# Patient Record
Sex: Male | Born: 1940 | Race: White | Hispanic: No | Marital: Married
Health system: Southern US, Community
[De-identification: ages and names within clinical notes are randomized; demographics above are authoritative.]

---

## 1998-12-21 ENCOUNTER — Other Ambulatory Visit: Admission: RE | Admit: 1998-12-21 | Discharge: 1998-12-21 | Payer: Self-pay | Admitting: Family Medicine

## 1999-02-15 ENCOUNTER — Ambulatory Visit (HOSPITAL_COMMUNITY): Admission: RE | Admit: 1999-02-15 | Discharge: 1999-02-15 | Payer: Self-pay | Admitting: Gastroenterology

## 1999-03-09 ENCOUNTER — Ambulatory Visit (HOSPITAL_COMMUNITY): Admission: RE | Admit: 1999-03-09 | Discharge: 1999-03-09 | Payer: Self-pay | Admitting: Gastroenterology

## 1999-03-09 ENCOUNTER — Encounter: Payer: Self-pay | Admitting: Gastroenterology

## 1999-03-20 ENCOUNTER — Ambulatory Visit (HOSPITAL_COMMUNITY): Admission: RE | Admit: 1999-03-20 | Discharge: 1999-03-20 | Payer: Self-pay | Admitting: Family Medicine

## 1999-03-20 ENCOUNTER — Encounter: Payer: Self-pay | Admitting: Family Medicine

## 1999-03-31 ENCOUNTER — Ambulatory Visit (HOSPITAL_COMMUNITY): Admission: RE | Admit: 1999-03-31 | Discharge: 1999-03-31 | Payer: Self-pay | Admitting: Family Medicine

## 1999-03-31 ENCOUNTER — Encounter: Payer: Self-pay | Admitting: Family Medicine

## 2003-09-21 ENCOUNTER — Encounter: Admission: RE | Admit: 2003-09-21 | Discharge: 2003-09-21 | Payer: Self-pay | Admitting: Family Medicine

## 2003-11-07 ENCOUNTER — Emergency Department (HOSPITAL_COMMUNITY): Admission: EM | Admit: 2003-11-07 | Discharge: 2003-11-07 | Payer: Self-pay | Admitting: Emergency Medicine

## 2004-03-13 ENCOUNTER — Encounter (INDEPENDENT_AMBULATORY_CARE_PROVIDER_SITE_OTHER): Payer: Self-pay | Admitting: *Deleted

## 2004-03-13 ENCOUNTER — Ambulatory Visit (HOSPITAL_COMMUNITY): Admission: RE | Admit: 2004-03-13 | Discharge: 2004-03-13 | Payer: Self-pay | Admitting: Gastroenterology

## 2004-04-04 ENCOUNTER — Encounter: Admission: RE | Admit: 2004-04-04 | Discharge: 2004-04-04 | Payer: Self-pay | Admitting: Family Medicine

## 2004-04-10 ENCOUNTER — Encounter: Admission: RE | Admit: 2004-04-10 | Discharge: 2004-04-10 | Payer: Self-pay | Admitting: Family Medicine

## 2004-09-05 ENCOUNTER — Encounter: Admission: RE | Admit: 2004-09-05 | Discharge: 2004-09-05 | Payer: Self-pay | Admitting: Family Medicine

## 2005-03-08 ENCOUNTER — Encounter (HOSPITAL_COMMUNITY): Admission: RE | Admit: 2005-03-08 | Discharge: 2005-06-06 | Payer: Self-pay | Admitting: Endocrinology

## 2005-03-25 IMAGING — CR DG CHEST 2V
2 series · 2 of 2 positions shown · non-contrast
Comparison: none

CLINICAL DATA: Cough.
 CHEST X-RAY:
 Two views of the chest are compared to a chest x-ray of 11/07/03.  Linear atelectasis or scarring is again noted at the left lung base.  No active infiltrate or effusion is seen.  The lungs are hyperaerated.  Nodular opacity at the right lung base probably represents nipple shadow.  A repeat PA view with nipple markers would be helpful. The heart is within normal limits in size.  Thoracolumbar scoliosis is noted.

[view not recorded (1 of 2)]
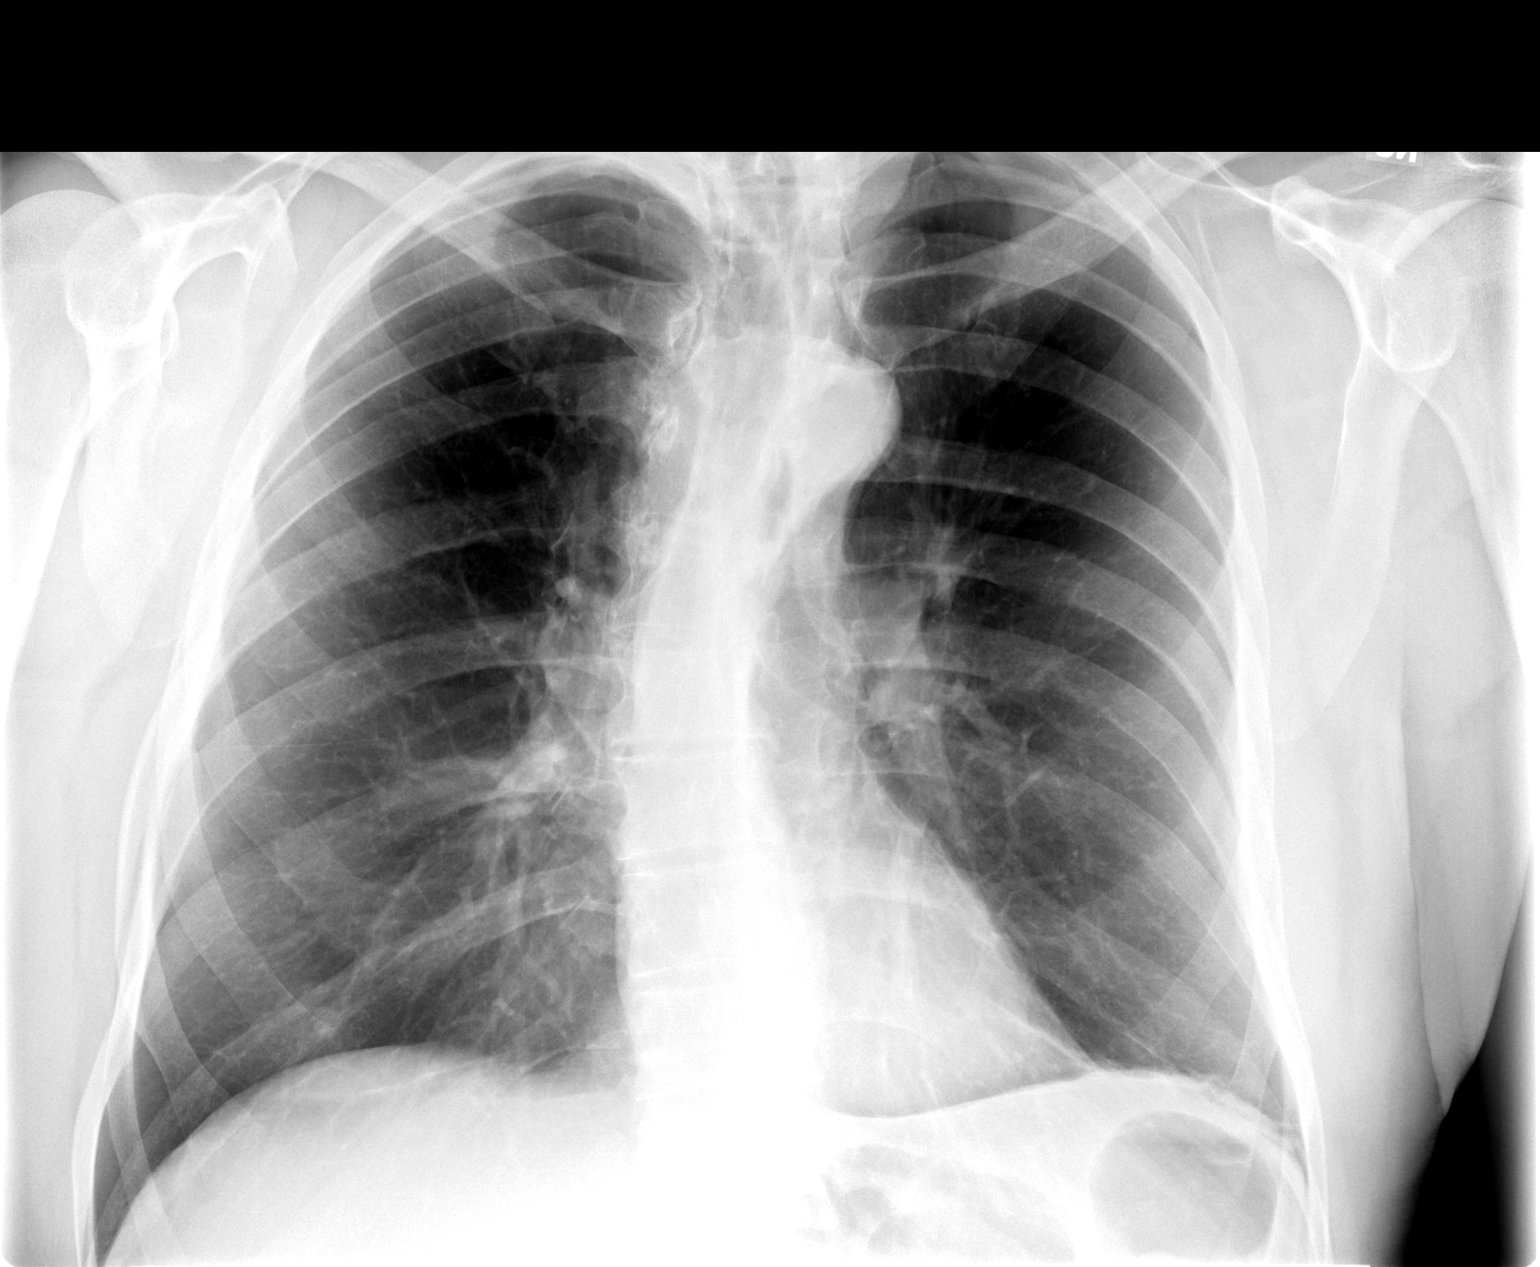

[view not recorded (2 of 2)]
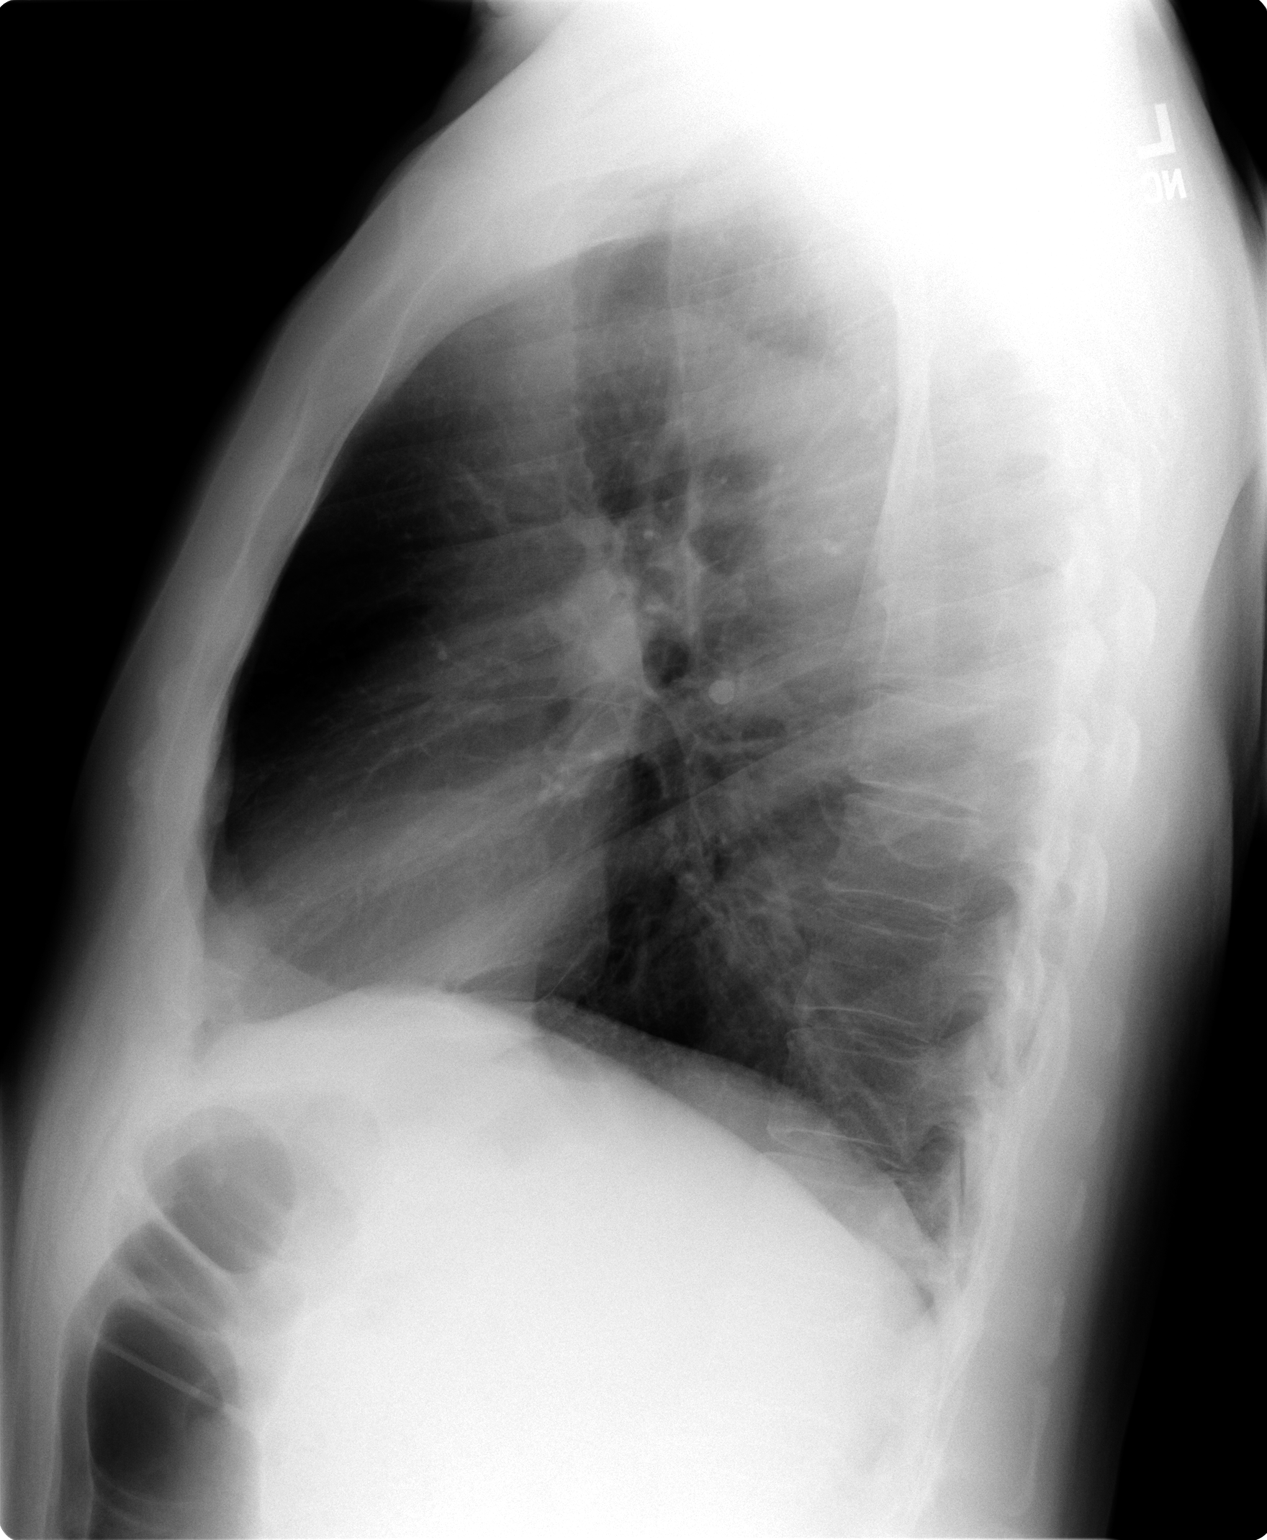

[2 of 2 positions shown; findings below may reference images not displayed]

IMPRESSION: 1.  COPD and probable linear scarring at the left base. 
 2.  Nodular opacity at the right lung base may represent nipple shadow.  Suggest repeat PA chest x-ray with nipple markers.

## 2005-03-31 IMAGING — CR DG CHEST SPECIAL VIEW
1 series · 1 of 1 positions shown · non-contrast
Comparison: none

CLINICAL DATA: Abnormal chest x-ray.
 REPEAT PA CHEST FILM WITH NIPPLE MARKERS
 I do not see the density that was seen on the prior film for certain.  It appears to correspond with the nipple marker, and it is probably a nipple shadow.  The cardiac silhouette, mediastinal and hilar contours are unchanged.  Minimal basilar scarring changes particularly on the left. 
 IMPRESSION
 No definite pulmonary nodule.  The density seen on the prior film appears to correspond to the patient?s right nipple.

[view not recorded]
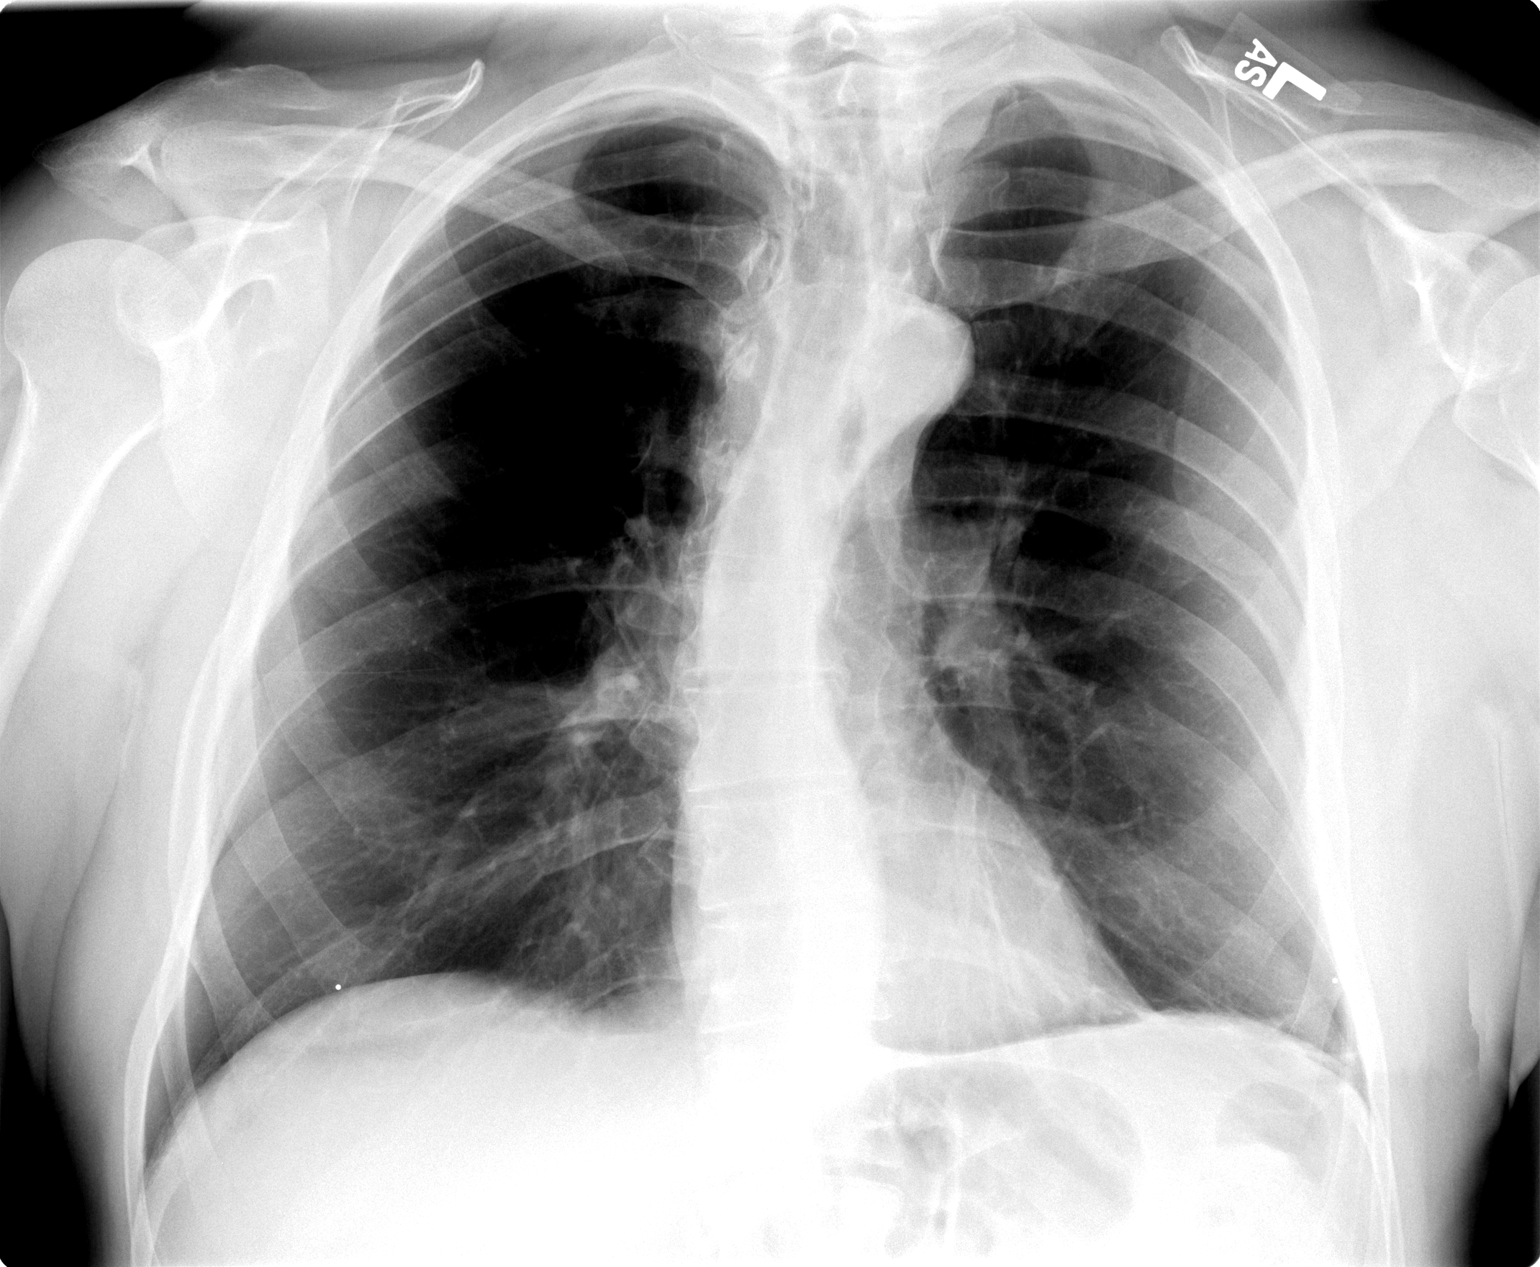

[1 of 1 positions shown; findings below may reference images not displayed]

## 2009-06-27 ENCOUNTER — Inpatient Hospital Stay (HOSPITAL_COMMUNITY): Admission: RE | Admit: 2009-06-27 | Discharge: 2009-06-28 | Payer: Self-pay | Admitting: Urology

## 2009-06-27 ENCOUNTER — Encounter (INDEPENDENT_AMBULATORY_CARE_PROVIDER_SITE_OTHER): Payer: Self-pay | Admitting: Urology

## 2010-09-03 LAB — COMPREHENSIVE METABOLIC PANEL
ALT: 15 U/L (ref 0–53)
AST: 19 U/L (ref 0–37)
Alkaline Phosphatase: 56 U/L (ref 39–117)
CO2: 28 mEq/L (ref 19–32)
Chloride: 101 mEq/L (ref 96–112)
GFR calc Af Amer: >60 mL/min (ref >60)
GFR calc non Af Amer: >60 mL/min (ref >60)
Glucose, Bld: 82 mg/dL (ref 70–99)
Sodium: 134 mEq/L — ABNORMAL LOW (ref 135–145)
Total Bilirubin: 0.7 mg/dL (ref 0.3–1.2)

## 2010-09-03 LAB — CBC
HCT: 47.1 % (ref 39.0–52.0)
Hemoglobin: 15.6 g/dL (ref 13.0–17.0)
MCV: 92.1 fL (ref 78.0–100.0)

## 2010-09-03 LAB — HEMOGLOBIN AND HEMATOCRIT, BLOOD
HCT: 35.3 % — ABNORMAL LOW (ref 39.0–52.0)
HCT: 41.1 % (ref 39.0–52.0)
Hemoglobin: 11.9 g/dL — ABNORMAL LOW (ref 13.0–17.0)
Hemoglobin: 13.7 g/dL (ref 13.0–17.0)

## 2010-09-03 LAB — TYPE AND SCREEN: ABO/RH(D): O POS

## 2010-09-03 LAB — ABO/RH: ABO/RH(D): O POS

## 2010-11-03 NOTE — Op Note (Signed)
NAMESHERRELL, Phillip Powers               ACCOUNT NO.:  [id number]   MEDICAL RECORD NO.:  [id number]          PATIENT TYPE:  AMB   LOCATION:  ENDO                         FACILITY:  [facility]   PHYSICIAN:  James L. Malon Kindle., M.D.DATE OF BIRTH:  06-Jul-1940   DATE OF PROCEDURE:  03/13/2004  DATE OF DISCHARGE:                                 OPERATIVE REPORT   PROCEDURE:  Colonoscopy and polypectomy.   MEDICATIONS:  1.  Fentanyl 62.5 mcg.  2.  Versed 8 mg IV.   INDICATION:  Previous history of adenomatous colon polyps.  This is done as  a 5 year follow up.   DESCRIPTION OF PROCEDURE:  The procedure had been explained to the patient  and consent obtained.  With the patient in the left lateral decubitus  position, the Olympus scope was inserted and advanced.  The patient had a  long, tortuous colon.  We were unable to pass hepatic flexure on previous  colonoscopies using abdominal pressure or position changes.  Using the  pediatric adjustable scope.  We were able to get to the hepatic flexure.  Then finally, with the patient in the right lateral decubitus position and  marked abdominal pressure and several people pressing on the stomach, we  were able to advance down to the cecum.  Ileocecal valve and appendiceal  orifice were seen.  The scope was withdrawn, and the ascending colon, cecum,  hepatic flexure were normal as was hepatic flexure with no polyps seen.  At  90 cm from the anal verge, what was felt to be the descending colon, a 0.75  cm polyp was removed with the snare.  In the sigmoid colon at 50 cm from the  anal verge, a 0.5 cm polyp was removed with the snare.  Both polyps were  recovered and placed in separate jars.  The scope was withdrawn.  No other  polyps were seen.  The patient tolerated the procedure well.   ASSESSMENT:  Polyps removed from the descending and sigmoid colon.  211.3.   PLAN:  1.  Routine postpolypectomy instructions.  2.  We will recommend repeating in 3  years and will hopefully will be able      to use the adult adjustable colonoscope.     JLE/MEDQ  D:  03/13/2004  T:  03/13/2004  Job:  [id number]   cc:   L. Lupe Carney, M.D.  [address] [place]  [place] [address]  Fax: [phone]

## 2011-08-03 ENCOUNTER — Other Ambulatory Visit: Payer: Self-pay | Admitting: Family Medicine

## 2011-08-03 DIAGNOSIS — R42 Dizziness and giddiness: Secondary | ICD-10-CM

## 2011-08-07 ENCOUNTER — Ambulatory Visit
Admission: RE | Admit: 2011-08-07 | Discharge: 2011-08-07 | Disposition: A | Discharge status: Home / Self Care | Payer: Medicare Other | Source: Ambulatory Visit | Attending: Family Medicine | Admitting: Family Medicine

## 2011-08-07 DIAGNOSIS — R42 Dizziness and giddiness: Secondary | ICD-10-CM

## 2011-08-07 MED ORDER — IOHEXOL 300 MG/ML  SOLN
75.0000 mL | Freq: Once | INTRAMUSCULAR | Status: AC | PRN
  Administered 2011-08-07: 75 mL via INTRAVENOUS

## 2018-04-01 ENCOUNTER — Emergency Department (HOSPITAL_COMMUNITY)
Admission: EM | Admit: 2018-04-01 | Discharge: 2018-04-01 | Disposition: A | Discharge status: Home / Self Care | Payer: Medicare Other | Attending: Emergency Medicine | Admitting: Emergency Medicine

## 2018-04-01 ENCOUNTER — Other Ambulatory Visit: Payer: Self-pay

## 2018-04-01 ENCOUNTER — Emergency Department (HOSPITAL_COMMUNITY): Payer: Medicare Other

## 2018-04-01 DIAGNOSIS — R55 Syncope and collapse: Secondary | ICD-10-CM

## 2018-04-01 DIAGNOSIS — N390 Urinary tract infection, site not specified: Secondary | ICD-10-CM | POA: Insufficient documentation

## 2018-04-01 LAB — BASIC METABOLIC PANEL
Anion gap: 7 (ref 5–15)
BUN: 14 mg/dL (ref 8–23)
CALCIUM: 8.8 mg/dL — AB (ref 8.9–10.3)
CO2: 25 mmol/L (ref 22–32)
Chloride: 108 mmol/L (ref 98–111)
Creatinine, Ser: 1.07 mg/dL (ref 0.61–1.24)
GFR calc Af Amer: >60 mL/min (ref >60)
GLUCOSE: 109 mg/dL — AB (ref 70–99)
Potassium: 4.2 mmol/L (ref 3.5–5.1)
Sodium: 140 mmol/L (ref 135–145)

## 2018-04-01 LAB — CBC
HCT: 38.6 % — ABNORMAL LOW (ref 39.0–52.0)
Hemoglobin: 11.8 g/dL — ABNORMAL LOW (ref 13.0–17.0)
MCH: 24.7 pg — ABNORMAL LOW (ref 26.0–34.0)
MCHC: 30.6 g/dL (ref 30.0–36.0)
MCV: 80.8 fL (ref 80.0–100.0)
PLATELETS: 220 10*3/uL (ref 150–400)
RBC: 4.78 MIL/uL (ref 4.22–5.81)
RDW: 16 % — ABNORMAL HIGH (ref 11.5–15.5)
WBC: 7.2 10*3/uL (ref 4.0–10.5)
nRBC: 0 % (ref 0.0–0.2)

## 2018-04-01 LAB — CBG MONITORING, ED: Glucose-Capillary: 98 mg/dL (ref 70–99)

## 2018-04-01 LAB — URINALYSIS, ROUTINE W REFLEX MICROSCOPIC
Bilirubin Urine: NEGATIVE
Glucose, UA: NEGATIVE mg/dL
Hgb urine dipstick: NEGATIVE
KETONES UR: 20 mg/dL — AB
Leukocytes, UA: NEGATIVE
Nitrite: POSITIVE — AB
PH: 6 (ref 5.0–8.0)
Protein, ur: NEGATIVE mg/dL
Specific Gravity, Urine: 1.018 (ref 1.005–1.030)

## 2018-04-01 LAB — I-STAT TROPONIN, ED: TROPONIN I, POC: 0 ng/mL (ref 0.00–0.08)

## 2018-04-01 MED ORDER — SULFAMETHOXAZOLE-TRIMETHOPRIM 800-160 MG PO TABS
1.0000 | ORAL_TABLET | Freq: Once | ORAL | Status: AC
  Administered 2018-04-01: 1 via ORAL
  Filled 2018-04-01: qty 1

## 2018-04-01 MED ORDER — SULFAMETHOXAZOLE-TRIMETHOPRIM 800-160 MG PO TABS: 1.0000 | ORAL_TABLET | Freq: Two times a day (BID) | ORAL | 0 refills | Status: AC

## 2018-04-01 NOTE — ED Notes (Signed)
Bed: WA01 Expected date:  Expected time:  Means of arrival:  Comments: EMS syncope 

## 2018-04-01 NOTE — ED Triage Notes (Signed)
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 Pt did not want to come to hospital. EMS states that gait is not steady which is not baseline. AO x4. Vitals were unstable on EMS arrival, but are now. Orthostatic positive. 110/72, HR 88, 97%, CBG 122

## 2018-04-01 NOTE — Discharge Instructions (Addendum)
You were evaluated in the Emergency Department and after careful evaluation, we did not find any emergent condition requiring admission or further testing in the hospital.  Your symptoms today seem to be due to urinary tract infection.  Please use the medication provided as directed and follow-up with your primary care provider.  We also recommend drinking plenty of fluids at home to stay hydrated.  Please return to the Emergency Department if you experience any worsening of your condition.  We encourage you to follow up with a primary care provider.  Thank you for allowing [place] to be a part of your care.

## 2018-04-01 NOTE — ED Provider Notes (Signed)
[facility] Emergency Department Provider Note MRN:  [id number]  Arrival date & time: 04/01/18     Chief Complaint   Loss of Consciousness   History of Present Illness   Phillip Powers is a 77 y.o. year-old male with no pertinent past medical history presenting to the ED with chief complaint of syncopal episode.  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  He was assisted to the ground, no trauma.  He was limp and unconscious and unresponsive for about 1 to 2 minutes, no bowel or bladder incontinence, no tongue biting.  When he woke up he was quickly back to baseline.  Patient denies chest pain preceding or after the syncopal event.  No recent fevers, no headache, no vision change, no shortness of breath, no abdominal pain, no cough or dysuria.  Review of Systems  A complete 10 system review of systems was obtained and all systems are negative except as noted in the HPI and PMH.   Patient's Health History   No past medical history on file.  Patient reports history of thyroid dysfunction   No family history on file.  Social history: Patient does not drink, does not smoke, no illicit drugs Social History   Socioeconomic History  . Marital status: Married    Spouse name: Not on file  . Number of children: Not on file  . Years of education: Not on file  . Highest education level: Not on file  Occupational History  . Not on file  Social Needs  . Financial resource strain: Not on file  . Food insecurity:    Worry: Not on file    Inability: Not on file  . Transportation needs:    Medical: Not on file    Non-medical: Not on file  Tobacco Use  . Smoking status: Not on file  Substance and Sexual Activity  . Alcohol use: Not on file  . Drug use: Not on file  . Sexual activity: Not on file  Lifestyle  .  Physical activity:    Days per week: Not on file    Minutes per session: Not on file  . Stress: Not on file  Relationships  . Social connections:    Talks on phone: Not on file    Gets together: Not on file    Attends religious service: Not on file    Active member of club or organization: Not on file    Attends meetings of clubs or organizations: Not on file    Relationship status: Not on file  . Intimate partner violence:    Fear of current or ex partner: Not on file    Emotionally abused: Not on file    Physically abused: Not on file    Forced sexual activity: Not on file  Other Topics Concern  . Not on file  Social History Narrative  . Not on file     Physical Exam  Vital Signs and Nursing Notes reviewed Vitals:   04/01/18 1500 04/01/18 1530  BP: 135/81 127/90  Pulse: 73 74  Resp: 14 (!) 21  Temp:    SpO2: 100% 98%    CONSTITUTIONAL: Well-appearing, NAD NEURO:  Alert and oriented x 3, no focal deficits EYES:  eyes equal and reactive ENT/NECK:  no LAD, no JVD CARDIO: Regular rate, well-perfused, normal S1 and S2 PULM:  CTAB no wheezing or rhonchi GI/GU:  normal bowel sounds, non-distended, non-tender MSK/SPINE:  No gross deformities, no edema SKIN:  no rash, atraumatic PSYCH:  Appropriate speech and behavior  Diagnostic and Interventional Summary    EKG Interpretation  Date/Time:  Tuesday April 01 2018 12:32:38 EDT Ventricular Rate:  85 PR Interval:    QRS Duration: 128 QT Interval:  380 QTC Calculation: 450 R Axis:   0 Text Interpretation:  Sinus rhythm IVCD, consider atypical RBBB Probable lateral infarct, old Artifact in lead(s) I II III aVR aVL aVF V1 V2 and baseline wander in lead(s) II aVR Confirmed by Kennis Carina [phone]) on 04/01/2018 1:11:18 PM      Labs Reviewed  BASIC METABOLIC PANEL - Abnormal; Notable for the following components:      Result Value   Glucose, Bld 109 (*)    Calcium 8.8 (*)    All other components within normal limits    CBC - Abnormal; Notable for the following components:   Hemoglobin 11.8 (*)    HCT 38.6 (*)    MCH 24.7 (*)    RDW 16.0 (*)    All other components within normal limits  URINALYSIS, ROUTINE W REFLEX MICROSCOPIC - Abnormal; Notable for the following components:   APPearance HAZY (*)    Ketones, ur 20 (*)    Nitrite POSITIVE (*)    Bacteria, UA MANY (*)    All other components within normal limits  CBG MONITORING, ED  I-STAT TROPONIN, ED    DG Chest 2 View  Final Result      Medications  sulfamethoxazole-trimethoprim (BACTRIM DS,SEPTRA DS) 800-160 MG per tablet 1 tablet (has no administration in time range)     Procedures Critical Care  ED Course and Medical Decision Making  I have reviewed the triage vital signs and the nursing notes.  Pertinent labs & imaging results that were available during my care of the patient were reviewed by me and considered in my medical decision making (see below for details).  Unclear etiology of syncopal episode in this 77 year old male.  Clear prodrome of lightheadedness and nausea, mildly hyperthermic here in the ED at 99.2, will begin infectious work-up and screen with EKG and troponin.  Patient is otherwise very healthy and if we are left with an unexplained syncopal episode there would not be an indication for admission.  Work-up reveals urinary tract infection, otherwise unremarkable.  Patient is a good candidate for outpatient management, penicillin allergy, will treat with Bactrim.  First dose here in the ED.  Has close PCP follow-up.  After the discussed management above, the patient was determined to be safe for discharge.  The patient was in agreement with this plan and all questions regarding their care were answered.  ED return precautions were discussed and the patient will return to the ED with any significant worsening of condition.  Elmer Sow. Pilar Plate, MD [facility] Emergency Medicine [facility]  Health [email or web address]  Final Clinical Impressions(s) / ED Diagnoses     ICD-10-CM   1. Lower urinary tract infectious disease N39.0   2. Syncope R55 DG Chest 2 View    DG Chest 2 View    ED Discharge Orders         Ordered    sulfamethoxazole-trimethoprim (BACTRIM DS,SEPTRA DS) 800-160 MG tablet  2 times daily     04/01/18 1555             Sabas Sous, MD 04/01/18 1556

## 2018-04-01 NOTE — ED Notes (Signed)
PATIENT RECEIVED 500cc NaCl en route.

## 2019-03-22 IMAGING — CR DG CHEST 2V
2 series · 2 of 2 positions shown · non-contrast
Comparison: 06/22/2009.

CLINICAL DATA: Syncopal episode.

EXAM:
CHEST - 2 VIEW

[w chest pa]
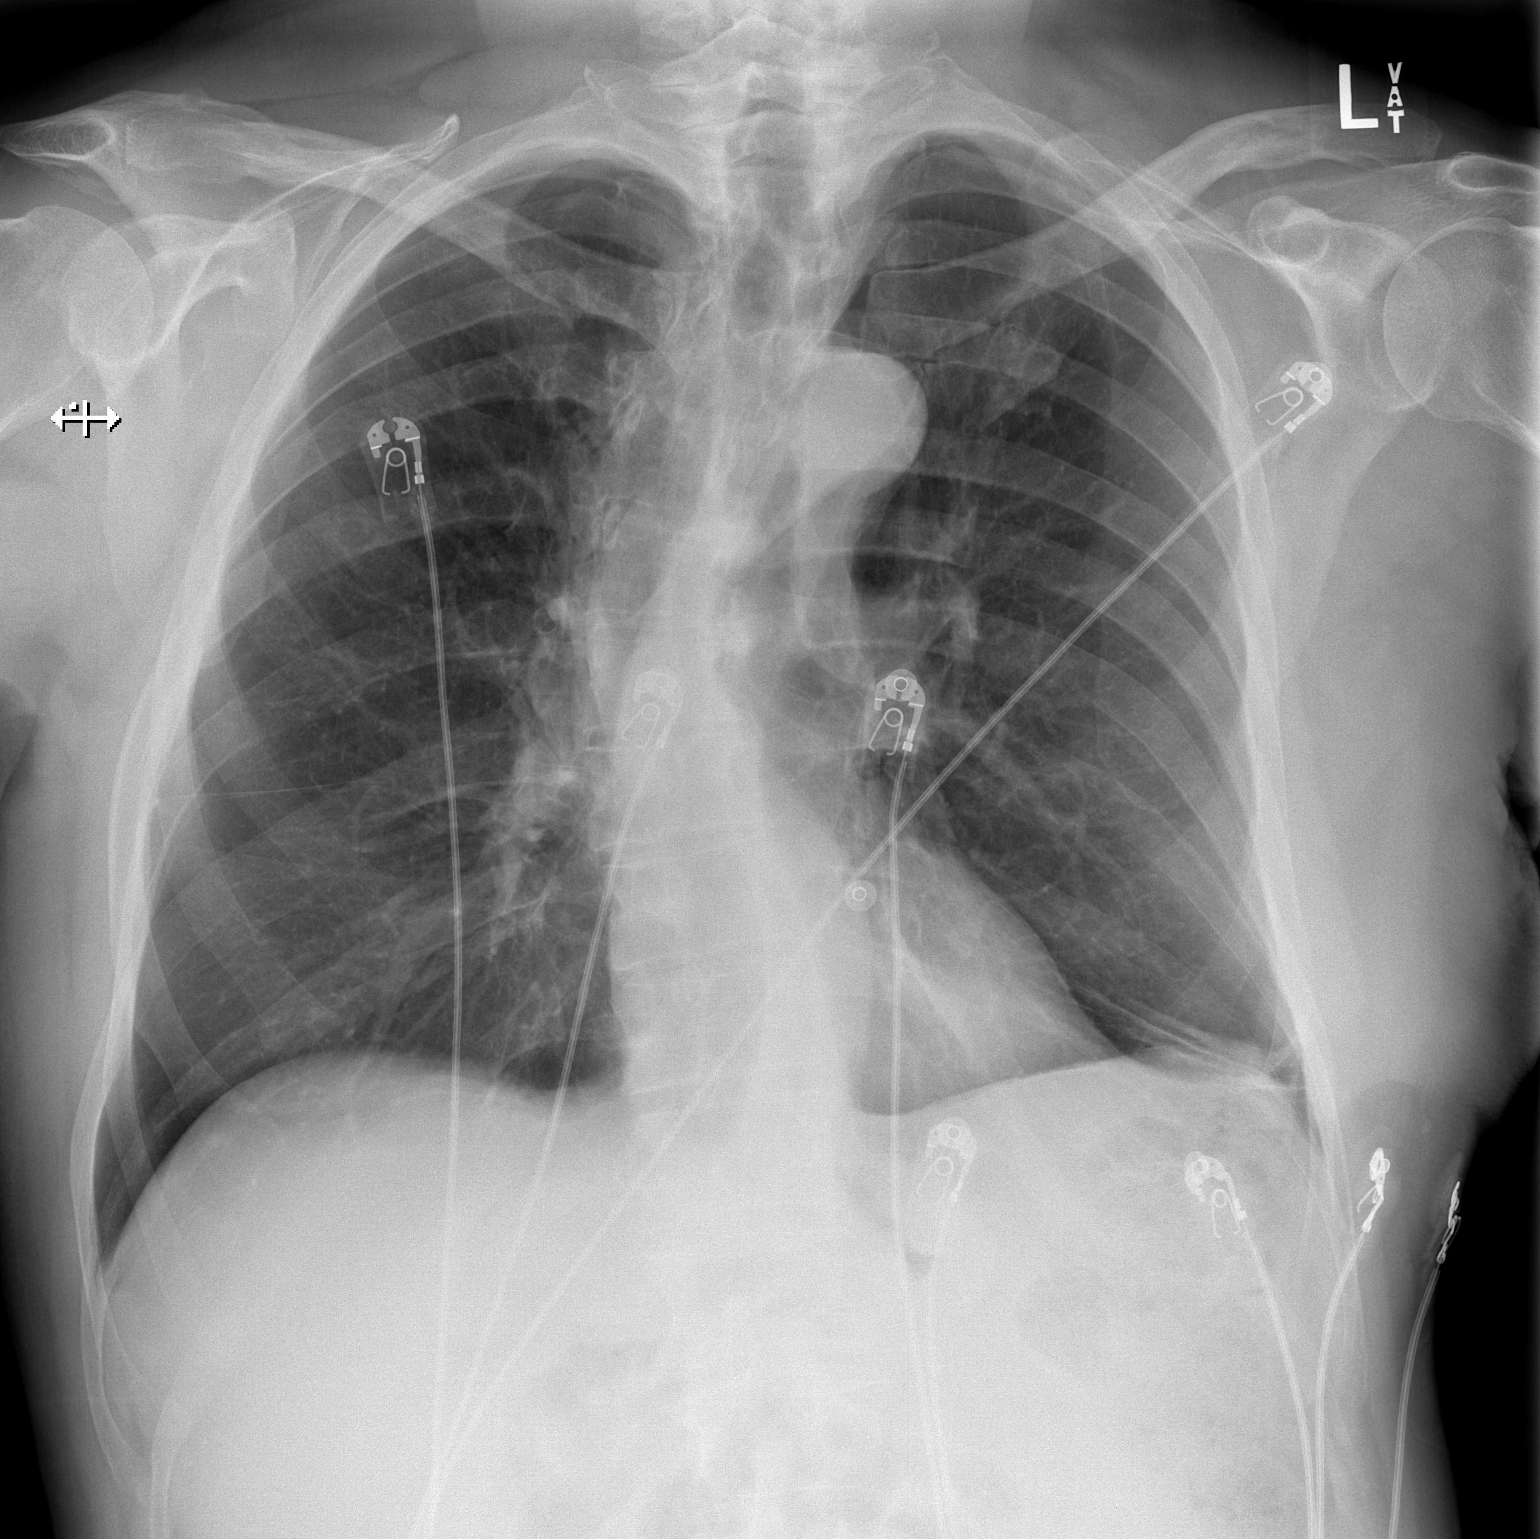

[w chest lat]
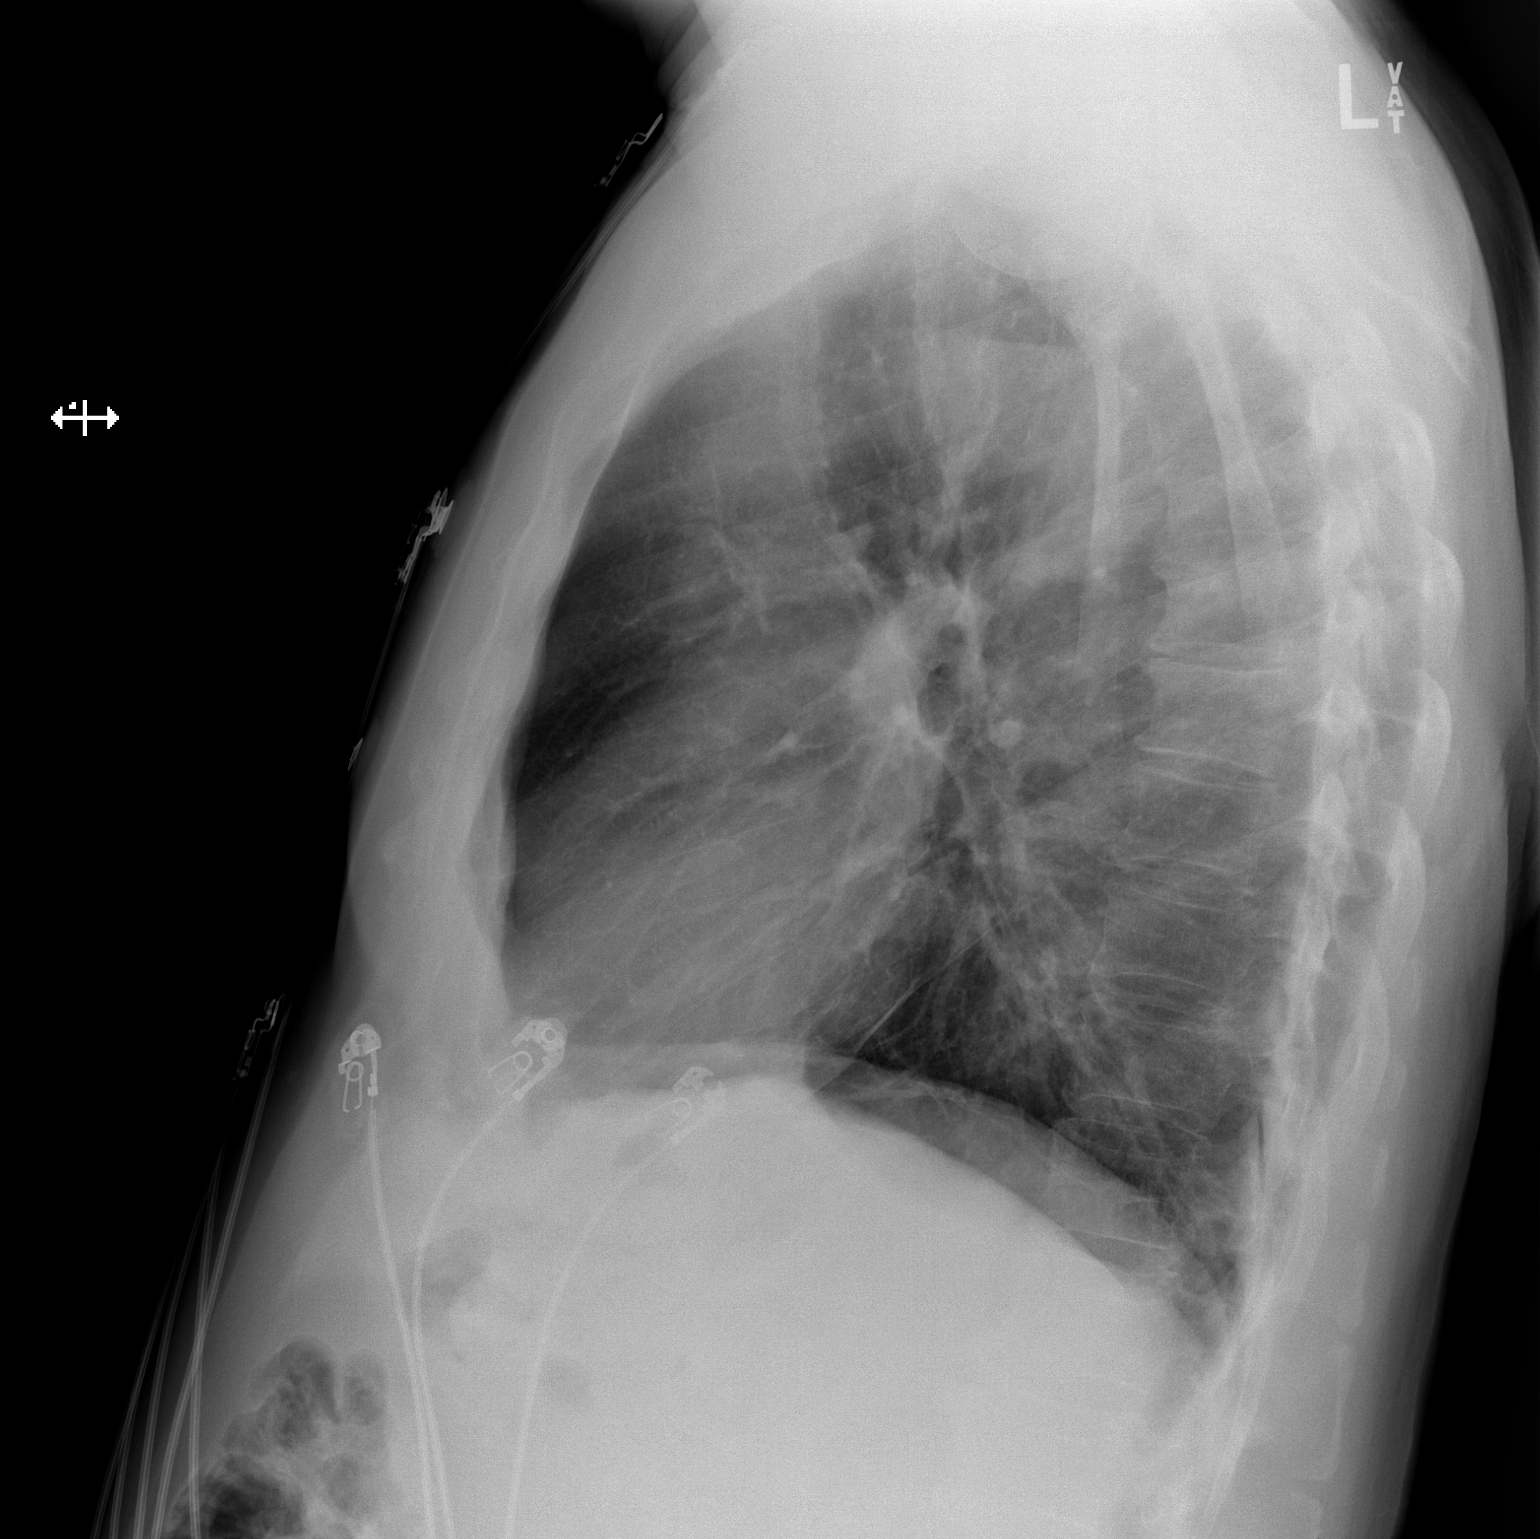

[2 of 2 positions shown; findings below may reference images not displayed]

FINDINGS: Mediastinum and hilar structures normal. Heart size normal. Mild
left base subsegmental atelectasis. No pleural effusion or
pneumothorax. Thoracic spine scoliosis.
IMPRESSION: Mild left base subsegmental atelectasis.

## 2020-11-16 DIAGNOSIS — E7800 Pure hypercholesterolemia, unspecified: Secondary | ICD-10-CM | POA: Diagnosis not present

## 2020-11-16 DIAGNOSIS — E890 Postprocedural hypothyroidism: Secondary | ICD-10-CM | POA: Diagnosis not present

## 2021-05-31 DIAGNOSIS — Z23 Encounter for immunization: Secondary | ICD-10-CM | POA: Diagnosis not present

## 2021-05-31 DIAGNOSIS — D692 Other nonthrombocytopenic purpura: Secondary | ICD-10-CM | POA: Diagnosis not present

## 2021-05-31 DIAGNOSIS — E7800 Pure hypercholesterolemia, unspecified: Secondary | ICD-10-CM | POA: Diagnosis not present

## 2021-05-31 DIAGNOSIS — Z0000 Encounter for general adult medical examination without abnormal findings: Secondary | ICD-10-CM | POA: Diagnosis not present

## 2021-05-31 DIAGNOSIS — E890 Postprocedural hypothyroidism: Secondary | ICD-10-CM | POA: Diagnosis not present

## 2021-05-31 DIAGNOSIS — H9190 Unspecified hearing loss, unspecified ear: Secondary | ICD-10-CM | POA: Diagnosis not present

## 2021-07-18 DIAGNOSIS — H10023 Other mucopurulent conjunctivitis, bilateral: Secondary | ICD-10-CM | POA: Diagnosis not present

## 2022-04-02 DIAGNOSIS — L723 Sebaceous cyst: Secondary | ICD-10-CM | POA: Diagnosis not present

## 2022-06-14 DIAGNOSIS — Z23 Encounter for immunization: Secondary | ICD-10-CM | POA: Diagnosis not present

## 2022-06-14 DIAGNOSIS — Z1211 Encounter for screening for malignant neoplasm of colon: Secondary | ICD-10-CM | POA: Diagnosis not present

## 2022-06-14 DIAGNOSIS — E7800 Pure hypercholesterolemia, unspecified: Secondary | ICD-10-CM | POA: Diagnosis not present

## 2022-06-14 DIAGNOSIS — Z86010 Personal history of colonic polyps: Secondary | ICD-10-CM | POA: Diagnosis not present

## 2022-06-14 DIAGNOSIS — H9190 Unspecified hearing loss, unspecified ear: Secondary | ICD-10-CM | POA: Diagnosis not present

## 2022-06-14 DIAGNOSIS — E890 Postprocedural hypothyroidism: Secondary | ICD-10-CM | POA: Diagnosis not present

## 2022-06-14 DIAGNOSIS — Z0000 Encounter for general adult medical examination without abnormal findings: Secondary | ICD-10-CM | POA: Diagnosis not present

## 2022-06-14 DIAGNOSIS — D692 Other nonthrombocytopenic purpura: Secondary | ICD-10-CM | POA: Diagnosis not present

## 2022-06-17 DIAGNOSIS — Z1211 Encounter for screening for malignant neoplasm of colon: Secondary | ICD-10-CM | POA: Diagnosis not present

## 2022-07-05 DIAGNOSIS — E890 Postprocedural hypothyroidism: Secondary | ICD-10-CM | POA: Diagnosis not present

## 2022-08-15 DIAGNOSIS — N390 Urinary tract infection, site not specified: Secondary | ICD-10-CM | POA: Diagnosis not present

## 2022-08-20 DIAGNOSIS — M6281 Muscle weakness (generalized): Secondary | ICD-10-CM | POA: Diagnosis not present

## 2022-09-13 DIAGNOSIS — M6281 Muscle weakness (generalized): Secondary | ICD-10-CM | POA: Diagnosis not present

## 2022-09-13 DIAGNOSIS — M62838 Other muscle spasm: Secondary | ICD-10-CM | POA: Diagnosis not present

## 2022-11-01 DIAGNOSIS — M6281 Muscle weakness (generalized): Secondary | ICD-10-CM | POA: Diagnosis not present

## 2022-11-01 DIAGNOSIS — M62838 Other muscle spasm: Secondary | ICD-10-CM | POA: Diagnosis not present

## 2022-12-14 DIAGNOSIS — E890 Postprocedural hypothyroidism: Secondary | ICD-10-CM | POA: Diagnosis not present

## 2022-12-14 DIAGNOSIS — Z86010 Personal history of colonic polyps: Secondary | ICD-10-CM | POA: Diagnosis not present

## 2022-12-14 DIAGNOSIS — E7800 Pure hypercholesterolemia, unspecified: Secondary | ICD-10-CM | POA: Diagnosis not present

## 2022-12-14 DIAGNOSIS — H9190 Unspecified hearing loss, unspecified ear: Secondary | ICD-10-CM | POA: Diagnosis not present

## 2022-12-14 DIAGNOSIS — R42 Dizziness and giddiness: Secondary | ICD-10-CM | POA: Diagnosis not present

## 2022-12-14 DIAGNOSIS — D692 Other nonthrombocytopenic purpura: Secondary | ICD-10-CM | POA: Diagnosis not present

## 2023-01-03 DIAGNOSIS — M6281 Muscle weakness (generalized): Secondary | ICD-10-CM | POA: Diagnosis not present

## 2023-01-03 DIAGNOSIS — M62838 Other muscle spasm: Secondary | ICD-10-CM | POA: Diagnosis not present

## 2023-01-10 DIAGNOSIS — H2513 Age-related nuclear cataract, bilateral: Secondary | ICD-10-CM | POA: Diagnosis not present

## 2023-02-08 DIAGNOSIS — E890 Postprocedural hypothyroidism: Secondary | ICD-10-CM | POA: Diagnosis not present

## 2023-03-30 DIAGNOSIS — Z23 Encounter for immunization: Secondary | ICD-10-CM | POA: Diagnosis not present

## 2023-04-09 DIAGNOSIS — H25043 Posterior subcapsular polar age-related cataract, bilateral: Secondary | ICD-10-CM | POA: Diagnosis not present

## 2023-04-09 DIAGNOSIS — H2511 Age-related nuclear cataract, right eye: Secondary | ICD-10-CM | POA: Diagnosis not present

## 2023-04-09 DIAGNOSIS — H18413 Arcus senilis, bilateral: Secondary | ICD-10-CM | POA: Diagnosis not present

## 2023-04-09 DIAGNOSIS — H353132 Nonexudative age-related macular degeneration, bilateral, intermediate dry stage: Secondary | ICD-10-CM | POA: Diagnosis not present

## 2023-04-09 DIAGNOSIS — H2513 Age-related nuclear cataract, bilateral: Secondary | ICD-10-CM | POA: Diagnosis not present

## 2023-06-14 DIAGNOSIS — E890 Postprocedural hypothyroidism: Secondary | ICD-10-CM | POA: Diagnosis not present

## 2023-06-14 DIAGNOSIS — D692 Other nonthrombocytopenic purpura: Secondary | ICD-10-CM | POA: Diagnosis not present

## 2023-06-14 DIAGNOSIS — E7800 Pure hypercholesterolemia, unspecified: Secondary | ICD-10-CM | POA: Diagnosis not present

## 2023-06-17 DIAGNOSIS — E7800 Pure hypercholesterolemia, unspecified: Secondary | ICD-10-CM | POA: Diagnosis not present

## 2023-06-17 DIAGNOSIS — Z1211 Encounter for screening for malignant neoplasm of colon: Secondary | ICD-10-CM | POA: Diagnosis not present

## 2023-06-17 DIAGNOSIS — Z0000 Encounter for general adult medical examination without abnormal findings: Secondary | ICD-10-CM | POA: Diagnosis not present

## 2023-06-17 DIAGNOSIS — H9190 Unspecified hearing loss, unspecified ear: Secondary | ICD-10-CM | POA: Diagnosis not present

## 2023-06-17 DIAGNOSIS — D692 Other nonthrombocytopenic purpura: Secondary | ICD-10-CM | POA: Diagnosis not present

## 2023-06-17 DIAGNOSIS — E890 Postprocedural hypothyroidism: Secondary | ICD-10-CM | POA: Diagnosis not present

## 2023-06-17 DIAGNOSIS — R079 Chest pain, unspecified: Secondary | ICD-10-CM | POA: Diagnosis not present

## 2024-03-17 ENCOUNTER — Ambulatory Visit
Admission: RE | Admit: 2024-03-17 | Discharge: 2024-03-17 | Disposition: A | Source: Ambulatory Visit | Attending: Family Medicine | Admitting: Family Medicine

## 2024-03-17 ENCOUNTER — Other Ambulatory Visit: Payer: Self-pay | Admitting: Family Medicine

## 2024-03-17 DIAGNOSIS — R634 Abnormal weight loss: Secondary | ICD-10-CM
